# Patient Record
Sex: Female | Born: 1955
Health system: Southern US, Community
[De-identification: ages and names within clinical notes are randomized; demographics above are authoritative.]

## PROBLEM LIST (undated history)

## (undated) DIAGNOSIS — G930 Cerebral cysts: Secondary | ICD-10-CM

## (undated) DIAGNOSIS — E785 Hyperlipidemia, unspecified: Secondary | ICD-10-CM

## (undated) DIAGNOSIS — N898 Other specified noninflammatory disorders of vagina: Secondary | ICD-10-CM

## (undated) DIAGNOSIS — N941 Unspecified dyspareunia: Secondary | ICD-10-CM

## (undated) DIAGNOSIS — L900 Lichen sclerosus et atrophicus: Secondary | ICD-10-CM

## (undated) HISTORY — DX: Cerebral cysts: G93.0

## (undated) HISTORY — PX: CARDIAC CATHETERIZATION: SHX172

## (undated) HISTORY — DX: Hyperlipidemia, unspecified: E78.5

## (undated) HISTORY — DX: Lichen sclerosus et atrophicus: L90.0

## (undated) HISTORY — DX: Other specified noninflammatory disorders of vagina: N89.8

## (undated) HISTORY — DX: Unspecified dyspareunia: N94.10

---

## 2000-11-15 ENCOUNTER — Ambulatory Visit (HOSPITAL_COMMUNITY): Admission: RE | Admit: 2000-11-15 | Discharge: 2000-11-15 | Payer: Self-pay | Admitting: Internal Medicine

## 2000-11-15 ENCOUNTER — Encounter: Payer: Self-pay | Admitting: Internal Medicine

## 2003-05-04 ENCOUNTER — Ambulatory Visit (HOSPITAL_COMMUNITY): Admission: RE | Admit: 2003-05-04 | Discharge: 2003-05-04 | Payer: Self-pay | Admitting: Family Medicine

## 2009-10-08 ENCOUNTER — Other Ambulatory Visit: Admission: RE | Admit: 2009-10-08 | Discharge: 2009-10-08 | Payer: Self-pay | Admitting: Obstetrics and Gynecology

## 2010-10-09 ENCOUNTER — Emergency Department (HOSPITAL_COMMUNITY)
Admission: EM | Admit: 2010-10-09 | Discharge: 2010-10-09 | Disposition: A | Discharge status: Home / Self Care | Payer: No Typology Code available for payment source | Attending: Emergency Medicine | Admitting: Emergency Medicine

## 2010-10-09 DIAGNOSIS — Y92410 Unspecified street and highway as the place of occurrence of the external cause: Secondary | ICD-10-CM | POA: Insufficient documentation

## 2010-10-09 DIAGNOSIS — IMO0002 Reserved for concepts with insufficient information to code with codable children: Secondary | ICD-10-CM | POA: Insufficient documentation

## 2010-10-09 DIAGNOSIS — Z79899 Other long term (current) drug therapy: Secondary | ICD-10-CM | POA: Insufficient documentation

## 2010-10-09 DIAGNOSIS — E7800 Pure hypercholesterolemia, unspecified: Secondary | ICD-10-CM | POA: Insufficient documentation

## 2010-10-14 ENCOUNTER — Other Ambulatory Visit: Payer: Self-pay | Admitting: Adult Health

## 2010-10-14 ENCOUNTER — Other Ambulatory Visit (HOSPITAL_COMMUNITY)
Admission: RE | Admit: 2010-10-14 | Discharge: 2010-10-14 | Disposition: A | Discharge status: Home / Self Care | Payer: 59 | Source: Ambulatory Visit | Attending: Obstetrics and Gynecology | Admitting: Obstetrics and Gynecology

## 2010-10-14 DIAGNOSIS — Z01419 Encounter for gynecological examination (general) (routine) without abnormal findings: Secondary | ICD-10-CM | POA: Insufficient documentation

## 2011-09-18 ENCOUNTER — Telehealth (HOSPITAL_COMMUNITY): Payer: Self-pay

## 2015-03-29 ENCOUNTER — Encounter: Payer: Self-pay | Admitting: Adult Health

## 2015-03-29 ENCOUNTER — Encounter: Payer: Self-pay | Admitting: *Deleted

## 2015-03-29 ENCOUNTER — Other Ambulatory Visit (HOSPITAL_COMMUNITY)
Admission: RE | Admit: 2015-03-29 | Discharge: 2015-03-29 | Disposition: A | Discharge status: Home / Self Care | Payer: Commercial Managed Care - HMO | Source: Ambulatory Visit | Attending: Adult Health | Admitting: Adult Health

## 2015-03-29 ENCOUNTER — Telehealth: Payer: Self-pay | Admitting: *Deleted

## 2015-03-29 ENCOUNTER — Ambulatory Visit (INDEPENDENT_AMBULATORY_CARE_PROVIDER_SITE_OTHER): Payer: Commercial Managed Care - HMO | Admitting: Adult Health

## 2015-03-29 VITALS — BP 140/90 | HR 60 | Ht 64.0 in | Wt 159.5 lb

## 2015-03-29 DIAGNOSIS — Z1212 Encounter for screening for malignant neoplasm of rectum: Secondary | ICD-10-CM

## 2015-03-29 DIAGNOSIS — Z1151 Encounter for screening for human papillomavirus (HPV): Secondary | ICD-10-CM | POA: Diagnosis present

## 2015-03-29 DIAGNOSIS — N898 Other specified noninflammatory disorders of vagina: Secondary | ICD-10-CM

## 2015-03-29 DIAGNOSIS — Z01419 Encounter for gynecological examination (general) (routine) without abnormal findings: Secondary | ICD-10-CM | POA: Diagnosis not present

## 2015-03-29 DIAGNOSIS — L900 Lichen sclerosus et atrophicus: Secondary | ICD-10-CM

## 2015-03-29 DIAGNOSIS — N941 Unspecified dyspareunia: Secondary | ICD-10-CM

## 2015-03-29 HISTORY — DX: Unspecified dyspareunia: N94.10

## 2015-03-29 HISTORY — DX: Lichen sclerosus et atrophicus: L90.0

## 2015-03-29 HISTORY — DX: Other specified noninflammatory disorders of vagina: N89.8

## 2015-03-29 LAB — HEMOCCULT GUIAC POC 1CARD (OFFICE): Fecal Occult Blood, POC: NEGATIVE

## 2015-03-29 MED ORDER — CLOBETASOL PROPIONATE 0.05 % EX CREA: TOPICAL_CREAM | CUTANEOUS | Status: DC

## 2015-03-29 NOTE — Progress Notes (Signed)
Patient ID: Kelsey RenshawCathy W Howell, female   DOB: 1955/09/19, 59 y.o.   MRN: [id number] History of Present Illness: Kelsey AngCathy is a 59 year old white female,G2P2,married in for a well woman gyn exam and pap.She is complaining of vaginal dryness and itching and pain with sex.Her last pap was 10/14/10,she had been helping care for her Mom and not her self she says. PCP is Dr Phillips OdorGolding.   Current Medications, Allergies, Past Medical History, Past Surgical History, Family History and Social History were reviewed in [facility] electronic medical record.     Review of Systems: Patient denies any daily headaches(but had middle of night headaches at same spot and saw Dr Phillips OdorGolding and had MRI,has brain cyst), hearing loss, fatigue, blurred vision, shortness of breath, chest pain, abdominal pain, problems with bowel movements, urination.No joint pain.Has some mood swings,see HPI for positives.She is still working at [employer]    Physical Exam:BP 140/90 mmHg  Pulse 60  Ht 5\' 4"  (1.626 m)  Wt 159 lb 8 oz (72.349 kg)  BMI 27.36 kg/m2 General:  Well developed, well nourished, no acute distress Skin:  Warm and dry Neck:  Midline trachea, normal thyroid, good ROM, no lymphadenopathy,no carotid bruits heard Lungs; Clear to auscultation bilaterally Breast:  No dominant palpable mass, retraction, or nipple discharge Cardiovascular: Regular rate and rhythm Abdomen:  Soft, non tender, no hepatosplenomegaly Pelvic:  External genitalia has thickened white skin and some excoriated areas, near clitoris and inner labia.  The vagina has decreased color, moisture and rugae. Urethra has no lesions or masses. The cervix is smooth,pap with HPV performed.  Uterus is felt to be normal size, shape, and contour.  No adnexal masses or tenderness noted.Bladder is non tender, no masses felt. Rectal: Good sphincter tone, no polyps, or hemorrhoids felt.  Hemoccult negative. Extremities/musculoskeletal:  No swelling or varicosities  noted, no clubbing or cyanosis Psych:  No mood changes, alert and cooperative,seems happy Discussed lichen sclerosus with her and showed her pictures,she is aware it is chronic condition,and that about 3% chance it is cancerous,if not better with temovate,will get biopsy.  Impression: Well woman gyn exam and pap Dyspareunia Vaginal dryness Lichen sclerosus      Plan: Rx temovate 0.05% cream use bid x 2 weeks then 2-3 x weekly with 2 refills Follow up in  4 weeks Try Luvena every 72 hours in vagina Try astroglide with sex Get mammogram now(past due) and yearly Colonoscopy advised Labs with PCP Physical in 1 year  Review handout on lichen sclerosus

## 2015-03-29 NOTE — Telephone Encounter (Signed)
Work note given

## 2015-03-29 NOTE — Patient Instructions (Addendum)
Follow up in 4 weeks Use temovate 2 x daily for 2 weeks then 2-3 x weekly Physical in  1 year Get mammogram now and yearly Use luvena every 3 days in vagina Get astroglide for sex put on you and him Labs with PCP Lichen Sclerosus Lichen sclerosus is a skin problem. It can happen on any part of the body, but it commonly involves the anal or genital areas. It can cause itching and discomfort in these areas. Treatment can help to control symptoms. When the genital area is affected, getting treatment is important because the condition can cause scarring that may lead to other problems. CAUSES The cause of this condition is not known. It could be the result of an overactive immune system or a lack of certain hormones. Lichen sclerosus is not an infection or a fungus. It is not passed from one person to another (not contagious). RISK FACTORS This condition is more likely to develop in women, usually after menopause. SYMPTOMS Symptoms of this condition include:  Thin, wrinkled, white areas on the skin.  Thickened white areas on the skin.  Red and swollen patches (lesions) on the skin.  Tears or cracks in the skin.  Bruising.  Blood blisters.  Severe itching. You may also have pain, itching, or burning with urination. Constipation is also common in people with lichen sclerosus. DIAGNOSIS This condition may be diagnosed with a physical exam. In some cases, a tissue sample (biopsy sample) may be removed to be looked at under a microscope. TREATMENT This condition is usually treated with medicated creams or ointments (topical steroids) that are applied over the affected areas. HOME CARE INSTRUCTIONS  Take over-the-counter and prescription medicines only as told by your health care provider.  Use creams or ointments as told by your health care provider.  Do not scratch the affected areas of skin.  Women should keep the vaginal area as clean and dry as possible.  Keep all follow-up  visits as told by your health care provider. This is important. SEEK MEDICAL CARE IF:  You have increasing redness, swelling, or pain in the affected area.  You have fluid, blood, or pus coming from the affected area.  You have new lesions on your skin.  You have pain or burning with urination.  You have pain during sex.   This information is not intended to replace advice given to you by your health care provider. Make sure you discuss any questions you have with your health care provider.   Document Released: 09/28/2010 Document Revised: 01/27/2015 Document Reviewed: 08/03/2014 Elsevier Interactive Patient Education [employer]2016 Elsevier Inc.

## 2015-03-30 LAB — CYTOLOGY - PAP

## 2015-04-26 ENCOUNTER — Ambulatory Visit (INDEPENDENT_AMBULATORY_CARE_PROVIDER_SITE_OTHER): Payer: Commercial Managed Care - HMO | Admitting: Adult Health

## 2015-04-26 ENCOUNTER — Encounter: Payer: Self-pay | Admitting: Adult Health

## 2015-04-26 VITALS — BP 122/80 | HR 56 | Ht 64.0 in | Wt 161.0 lb

## 2015-04-26 DIAGNOSIS — L900 Lichen sclerosus et atrophicus: Secondary | ICD-10-CM

## 2015-04-26 DIAGNOSIS — N898 Other specified noninflammatory disorders of vagina: Secondary | ICD-10-CM

## 2015-04-26 NOTE — Patient Instructions (Signed)
Use temovate 2-3 x weekly  Follow up in 3 months

## 2015-04-26 NOTE — Progress Notes (Signed)
Subjective:     Patient ID: Lisbeth Renshawathy W Wentz, female   DOB: 08/26/1955, 59 y.o.   MRN: [id number]  HPI Lynden AngCathy is a 59 year old white female,married back in follow up of using luvena,astro glide and temovate for vaginal dryness, dyspareunia and lichen sclerosis,she used luvena and astro glide the first time and was fine and then the second time felt irritated.Sex was less painful and since using temovate has no itching.She is still working full time and caring for her Mom.  Review of Systems Patient denies any headaches, hearing loss, fatigue, blurred vision, shortness of breath, chest pain, abdominal pain, problems with bowel movements, urination, or intercourse. No joint pain or mood swings.See HPI for positives. Reviewed past medical,surgical, social and family history. Reviewed medications and allergies.     Objective:   Physical Exam BP 122/80 mmHg  Pulse 56  Ht 5\' 4"  (1.626 m)  Wt 161 lb (73.029 kg)  BMI 27.62 kg/m2Skin warm and dry, on exam external genitalia has no white areas,tissues thin, has some redness near rectal area, has had diarrhea     Assessment:     Vaginal dryness Lichen sclerosus     Plan:    Note given to return to work today Continue temovate 2-3 x weekly Try luvena and astro glide again Return in 3 months for follow up exam   Can use Desitin and Vaseline after BMs

## 2015-07-27 ENCOUNTER — Encounter: Payer: Self-pay | Admitting: Adult Health

## 2015-07-27 ENCOUNTER — Ambulatory Visit (INDEPENDENT_AMBULATORY_CARE_PROVIDER_SITE_OTHER): Payer: Commercial Managed Care - HMO | Admitting: Adult Health

## 2015-07-27 VITALS — BP 130/80 | HR 60 | Ht 64.0 in | Wt 164.0 lb

## 2015-07-27 DIAGNOSIS — L900 Lichen sclerosus et atrophicus: Secondary | ICD-10-CM

## 2015-07-27 DIAGNOSIS — N898 Other specified noninflammatory disorders of vagina: Secondary | ICD-10-CM | POA: Diagnosis not present

## 2015-07-27 NOTE — Progress Notes (Signed)
Subjective:     Patient ID: Kelsey Howell, female   DOB: 12-Jun-1955, 60 y.o.   MRN: [id number]  HPI Kelsey Howell is a 60 year old white female, married back in follow up of using temovate for lichen sclerosus, and she says she feels much better.Has noted pain in right wrist when first starts having sex then it is gone.  Review of Systems Patient denies any headaches, hearing loss, fatigue, blurred vision, shortness of breath, chest pain, abdominal pain, problems with bowel movements, urination.  No joint pain or mood swings.See HPI for positives. Reviewed past medical,surgical, social and family history. Reviewed medications and allergies.     Objective:   Physical Exam BP 130/80 mmHg  Pulse 60  Ht 5\' 4"  (1.626 m)  Wt 164 lb (74.39 kg)  BMI 28.14 kg/m2Skin warm and dry, there are no areas of whiteness, skin is still thin but looks much better and feels better.    Assessment:     Lichen sclerosus  Vaginal dryness    Plan:       Continue temovate 2-3 x a week Use astroglide and try luvena again, put astro glide on him too. Note given for work, stating was here this am Follow up prn

## 2015-07-27 NOTE — Patient Instructions (Signed)
Temovate 2-3 x weekly  Follow up prn

## 2016-07-11 ENCOUNTER — Emergency Department (HOSPITAL_COMMUNITY): Payer: Commercial Managed Care - HMO

## 2016-07-11 ENCOUNTER — Encounter (HOSPITAL_COMMUNITY): Payer: Self-pay | Admitting: *Deleted

## 2016-07-11 ENCOUNTER — Emergency Department (HOSPITAL_COMMUNITY)
Admission: EM | Admit: 2016-07-11 | Discharge: 2016-07-11 | Disposition: A | Discharge status: Home / Self Care | Payer: Commercial Managed Care - HMO | Attending: Emergency Medicine | Admitting: Emergency Medicine

## 2016-07-11 DIAGNOSIS — G5602 Carpal tunnel syndrome, left upper limb: Secondary | ICD-10-CM | POA: Diagnosis not present

## 2016-07-11 DIAGNOSIS — Z5181 Encounter for therapeutic drug level monitoring: Secondary | ICD-10-CM | POA: Insufficient documentation

## 2016-07-11 DIAGNOSIS — R200 Anesthesia of skin: Secondary | ICD-10-CM | POA: Diagnosis not present

## 2016-07-11 DIAGNOSIS — R9431 Abnormal electrocardiogram [ECG] [EKG]: Secondary | ICD-10-CM | POA: Diagnosis not present

## 2016-07-11 DIAGNOSIS — Z79899 Other long term (current) drug therapy: Secondary | ICD-10-CM | POA: Insufficient documentation

## 2016-07-11 LAB — DIFFERENTIAL
BASOS PCT: 1 %
Basophils Absolute: 0.1 10*3/uL (ref 0.0–0.1)
EOS ABS: 0.4 10*3/uL (ref 0.0–0.7)
Eosinophils Relative: 4 %
Lymphocytes Relative: 30 %
Lymphs Abs: 3 10*3/uL (ref 0.7–4.0)
MONO ABS: 0.7 10*3/uL (ref 0.1–1.0)
MONOS PCT: 7 %
Neutro Abs: 6 10*3/uL (ref 1.7–7.7)
Neutrophils Relative %: 58 %

## 2016-07-11 LAB — CBC
HCT: 41.9 % (ref 36.0–46.0)
Hemoglobin: 14.2 g/dL (ref 12.0–15.0)
MCH: 29.9 pg (ref 26.0–34.0)
MCHC: 33.9 g/dL (ref 30.0–36.0)
MCV: 88.2 fL (ref 78.0–100.0)
Platelets: 274 10*3/uL (ref 150–400)
RBC: 4.75 MIL/uL (ref 3.87–5.11)
RDW: 13.3 % (ref 11.5–15.5)
WBC: 10.2 10*3/uL (ref 4.0–10.5)

## 2016-07-11 LAB — PROTIME-INR
INR: 0.93
Prothrombin Time: 12.4 seconds (ref 11.4–15.2)

## 2016-07-11 LAB — COMPREHENSIVE METABOLIC PANEL
ALT: 22 U/L (ref 14–54)
AST: 25 U/L (ref 15–41)
Albumin: 4 g/dL (ref 3.5–5.0)
Alkaline Phosphatase: 72 U/L (ref 38–126)
Anion gap: 10 (ref 5–15)
BUN: 12 mg/dL (ref 6–20)
CHLORIDE: 104 mmol/L (ref 101–111)
CO2: 24 mmol/L (ref 22–32)
CREATININE: 0.9 mg/dL (ref 0.44–1.00)
Calcium: 9.4 mg/dL (ref 8.9–10.3)
Glucose, Bld: 96 mg/dL (ref 65–99)
POTASSIUM: 3.7 mmol/L (ref 3.5–5.1)
Sodium: 138 mmol/L (ref 135–145)
TOTAL PROTEIN: 7.8 g/dL (ref 6.5–8.1)
Total Bilirubin: 0.8 mg/dL (ref 0.3–1.2)

## 2016-07-11 LAB — I-STAT TROPONIN, ED: TROPONIN I, POC: 0 ng/mL (ref 0.00–0.08)

## 2016-07-11 LAB — APTT: aPTT: 30 seconds (ref 24–36)

## 2016-07-11 NOTE — ED Triage Notes (Signed)
Pt reports onset approx 1500 while at work, she reached to get something and then onset of numbness to left forearm and fingers. Also had numbness to left side of face and "felt like her face was drawing up." reports episode lasted a few minutes and then resolved. No neuro deficits noted at triage. Grips are equal, no facial droop noted.

## 2016-07-12 NOTE — ED Provider Notes (Signed)
[facility]-EMERGENCY DEPT Provider Note   CSN: [id number] Arrival date & time: 07/11/16  1549     History   Chief Complaint Chief Complaint  Patient presents with  . Numbness    HPI Kelsey Howell is a 61 y.o. female.  HPI Patient presents with tingling and numbness in her left hand. Began she was at work around 3:00. States it started in her forearm into her hand and was in her ring and middle finger. Felt like when hand will fall asleep. She gets episodes of this at night. Lasted a few minutes and resolved after she been some shaking on it. States also during the episode her left side of her face began to tingle and drew back a little bit. No difficulty speaking. No reported facial asymmetry with people saw her. No drooling.   Past Medical History:  Diagnosis Date  . Cyst of brain   . Dyspareunia, female 03/29/2015  . Hyperlipidemia   . Lichen sclerosus 03/29/2015  . Vaginal dryness 03/29/2015    Patient Active Problem List   Diagnosis Date Noted  . Dyspareunia, female 03/29/2015  . Vaginal dryness 03/29/2015  . Lichen sclerosus 03/29/2015    Past Surgical History:  Procedure Laterality Date  . CARDIAC CATHETERIZATION      OB History    Gravida Para Term Preterm AB Living   2 2       2    SAB TAB Ectopic Multiple Live Births                   Home Medications    Prior to Admission medications   Medication Sig Start Date End Date Taking? Authorizing Provider  acetaminophen (TYLENOL) 500 MG tablet Take 500 mg by mouth every 6 (six) hours as needed for headache (or pain).    Yes Historical Provider, MD  clobetasol cream (TEMOVATE) 0.05 % Use cream 2 x daily for 2 weeks then 2-3 x per week Patient taking differently: Apply 1 application topically See admin instructions. Two to three times a week as needed for itching 03/29/15  Yes Adline Potter, NP  ibuprofen (ADVIL,MOTRIN) 200 MG tablet Take 200 mg by mouth every 6 (six) hours as needed (for hand pain).    Yes  Historical Provider, MD  pravastatin (PRAVACHOL) 20 MG tablet Take 10 mg by mouth daily.   Yes Historical Provider, MD    Family History Family History  Problem Relation Age of Onset  . Diabetes Mother   . Hyperlipidemia Mother   . Heart attack Mother   . Dementia Mother   . Heart attack Father   . Heart attack Brother   . Ulcerative colitis Daughter   . Ulcerative colitis Son   . Cancer Maternal Grandmother   . Stroke Paternal Grandmother   . Heart attack Paternal Grandfather   . Ulcerative colitis Brother     Social History Social History  Substance Use Topics  . Smoking status: Never Smoker  . Smokeless tobacco: Never Used  . Alcohol use No     Allergies   Niacin and related and Tape   Review of Systems Review of Systems  Constitutional: Negative for appetite change.  HENT: Negative for congestion.   Respiratory: Negative for shortness of breath.   Cardiovascular: Negative for chest pain.  Gastrointestinal: Negative for abdominal pain.  Genitourinary: Negative for dyspareunia.  Musculoskeletal: Negative for back pain.  Neurological: Positive for numbness. Negative for facial asymmetry and weakness.  Hematological: Negative for adenopathy.  Psychiatric/Behavioral:  Negative for agitation.     Physical Exam Updated Vital Signs BP 128/84   Pulse 61   Temp 98.7 F (37.1 C) (Oral)   Resp 16   SpO2 97%   Physical Exam  Constitutional: She appears well-developed.  HENT:  Head: Normocephalic and atraumatic.  Eyes: EOM are normal.  Neck: Neck supple.  Cardiovascular: Normal rate.   Pulmonary/Chest: Effort normal.  Abdominal: Soft.  Musculoskeletal: Normal range of motion. She exhibits no edema.  Neurological: She is alert.  Face symmetric. Equal smile. Equal sensation in bilateral face. Good grips bilaterally. Sensation intact over radial median and ulnar distribution. Good flexion-extension at the shoulders elbows and wrists.  Skin: Skin is warm.  Capillary refill takes less than 2 seconds.     ED Treatments / Results  Labs (all labs ordered are listed, but only abnormal results are displayed) Labs Reviewed  PROTIME-INR  APTT  CBC  DIFFERENTIAL  COMPREHENSIVE METABOLIC PANEL  I-STAT TROPOININ, ED    EKG  EKG Interpretation  Date/Time:  Tuesday July 11 2016 16:04:03 EST Ventricular Rate:  82 PR Interval:  166 QRS Duration: 84 QT Interval:  378 QTC Calculation: 441 R Axis:   -49 Text Interpretation:  Normal sinus rhythm Left anterior fascicular block Abnormal ECG Confirmed by Rubin PayorPICKERING  MD, Harrold DonathNATHAN [phone]([id number]) on 07/11/2016 9:11:13 PM       Radiology Ct Head Wo Contrast  Result Date: 07/11/2016 CLINICAL DATA:  Facial numbness and numbness in the left upper extremity began today, no trauma EXAM: CT HEAD WITHOUT CONTRAST TECHNIQUE: Contiguous axial images were obtained from the base of the skull through the vertex without intravenous contrast. COMPARISON:  None. FINDINGS: Brain: The ventricular system is within upper limits of normal and the septum is midline in position. The fourth ventricle and basilar cisterns are unremarkable. No hemorrhage, mass lesion, or acute infarction is seen. Low-attenuation in the anterior left temporal lobe probably represents an arachnoid cyst and comparison with any prior imaging of the brain is recommended to assess stability. This area measures approximately 16 x 32 mm on axial image 7 series 2. Vascular: No vascular abnormality is seen on this unenhanced study. Skull: No calvarial abnormality is seen. Sinuses/Orbits: The paranasal sinuses are well pneumatized. Other: None. IMPRESSION: 1. No acute intracranial abnormality. 2. Low-attenuation in the anterior left temporal lobe probably represents an arachnoid cyst. Compare with any prior brain imaging exam for stability. Electronically Signed   By: Dwyane DeePaul  Barry M.D.   On: 07/11/2016 16:51    Procedures Procedures (including critical care  time)  Medications Ordered in ED Medications - No data to display   Initial Impression / Assessment and Plan / ED Course  I have reviewed the triage vital signs and the nursing notes.  Pertinent labs & imaging results that were available during my care of the patient were reviewed by me and considered in my medical decision making (see chart for details).     Patient with left hand tingling. I think it is more likely carpal tunnel. She is working in [employer] and has episodes like this at night. The face is not associated with it in the past. Nonfocal exam now. Has a known arachnoid cyst. Somewhat similar in size from MRI 2 years ago. Patient was aware of the cyst. Has a brace to wear at home for wrist. Will discharge home to follow-up with her primary care doctor.  Final Clinical Impressions(s) / ED Diagnoses   Final diagnoses:  Carpal tunnel syndrome of left  wrist    New Prescriptions Discharge Medication List as of 07/11/2016  9:38 PM       Benjiman Core, MD 07/12/16 [phone]

## 2016-08-28 DIAGNOSIS — Z1389 Encounter for screening for other disorder: Secondary | ICD-10-CM | POA: Diagnosis not present

## 2016-08-28 DIAGNOSIS — R7309 Other abnormal glucose: Secondary | ICD-10-CM | POA: Diagnosis not present

## 2016-08-28 DIAGNOSIS — E782 Mixed hyperlipidemia: Secondary | ICD-10-CM | POA: Diagnosis not present

## 2016-08-28 DIAGNOSIS — R200 Anesthesia of skin: Secondary | ICD-10-CM | POA: Diagnosis not present

## 2016-11-28 DIAGNOSIS — M545 Low back pain: Secondary | ICD-10-CM | POA: Diagnosis not present

## 2016-11-28 DIAGNOSIS — M5410 Radiculopathy, site unspecified: Secondary | ICD-10-CM | POA: Diagnosis not present

## 2017-01-02 DIAGNOSIS — Z1211 Encounter for screening for malignant neoplasm of colon: Secondary | ICD-10-CM | POA: Diagnosis not present

## 2017-01-16 ENCOUNTER — Ambulatory Visit (INDEPENDENT_AMBULATORY_CARE_PROVIDER_SITE_OTHER): Payer: 59 | Admitting: Adult Health

## 2017-01-16 ENCOUNTER — Encounter: Payer: Self-pay | Admitting: Adult Health

## 2017-01-16 ENCOUNTER — Other Ambulatory Visit (HOSPITAL_COMMUNITY)
Admission: RE | Admit: 2017-01-16 | Discharge: 2017-01-16 | Disposition: A | Discharge status: Home / Self Care | Payer: 59 | Source: Ambulatory Visit | Attending: Adult Health | Admitting: Adult Health

## 2017-01-16 VITALS — BP 120/80 | HR 78 | Ht 65.0 in | Wt 164.0 lb

## 2017-01-16 DIAGNOSIS — Z01419 Encounter for gynecological examination (general) (routine) without abnormal findings: Secondary | ICD-10-CM

## 2017-01-16 DIAGNOSIS — L900 Lichen sclerosus et atrophicus: Secondary | ICD-10-CM

## 2017-01-16 DIAGNOSIS — Z01411 Encounter for gynecological examination (general) (routine) with abnormal findings: Secondary | ICD-10-CM | POA: Diagnosis not present

## 2017-01-16 DIAGNOSIS — N898 Other specified noninflammatory disorders of vagina: Secondary | ICD-10-CM | POA: Diagnosis not present

## 2017-01-16 NOTE — Patient Instructions (Signed)
Physical in 1 year Pap in 3 if normal Mammogram yearly  

## 2017-01-16 NOTE — Progress Notes (Addendum)
Patient ID: Kelsey Howell, female   DOB: Jul 28, 1955, 61 y.o.   MRN: [id number] History of Present Illness: Kelsey Howell is a 61 year old white female, married in for well woman gyn exam and pap.She is retired now. PCP is Dr Phillips Odor.    Current Medications, Allergies, Past Medical History, Past Surgical History, Family History and Social History were reviewed in [employer] [facility] record.     Review of Systems: Patient denies any headaches, hearing loss, fatigue, blurred vision, shortness of breath, chest pain, abdominal pain, problems with bowel movements, urination, or intercourse(sex painful at times). No joint pain or mood swings. +vaginal dryness   Physical Exam:BP 120/80 (BP Location: Left Arm, Patient Position: Sitting, Cuff Size: Small)   Pulse 78   Ht 5\' 5"  (1.651 m)   Wt 164 lb (74.4 kg)   BMI 27.29 kg/m  General:  Well developed, well nourished, no acute distress Skin:  Warm and dry Neck:  Midline trachea, normal thyroid, good ROM, no lymphadenopathy,no carotid bruits heard. Lungs; Clear to auscultation bilaterally Breast:  No dominant palpable mass, retraction, or nipple discharge Cardiovascular: Regular rate and rhythm Abdomen:  Soft, non tender, no hepatosplenomegaly Pelvic:  External genitalia is normal in appearance, except has area of LSA on right inner labia and near clitoris   The vagina is pale and dry Urethra has no lesions or masses. The cervix is smooth, pap with HPV performed.  Uterus is felt to be normal size, shape, and contour.  No adnexal masses or tenderness noted.Bladder is non tender, no masses felt. Rectal: deferred just did cologard cards  Extremities/musculoskeletal:  No swelling or varicosities noted, no clubbing or cyanosis Psych:  No mood changes, alert and cooperative,seems happy PHQ 2 score 0.  Impression: 1. Encounter for gynecological examination with Papanicolaou smear of cervix   2. Lichen sclerosus   3. Vaginal dryness        Plan: Use temovate 2-3 x weekly Try luvena again Use astroglide with sex  Physical in 1 year Pap in 3 if normal Mammogram 9/10 at [facility] and then yearly Labs with PCP

## 2017-01-16 NOTE — Addendum Note (Signed)
Addended by: Federico Flake A on: 01/16/2017 09:54 AM   Modules accepted: Orders

## 2017-01-17 LAB — CYTOLOGY - PAP
Diagnosis: NEGATIVE
HPV: NOT DETECTED

## 2017-01-29 DIAGNOSIS — Z1231 Encounter for screening mammogram for malignant neoplasm of breast: Secondary | ICD-10-CM | POA: Diagnosis not present

## 2017-03-06 DIAGNOSIS — Z23 Encounter for immunization: Secondary | ICD-10-CM | POA: Diagnosis not present

## 2017-05-21 DIAGNOSIS — J209 Acute bronchitis, unspecified: Secondary | ICD-10-CM | POA: Diagnosis not present

## 2017-05-21 DIAGNOSIS — R05 Cough: Secondary | ICD-10-CM | POA: Diagnosis not present

## 2017-08-22 DIAGNOSIS — E782 Mixed hyperlipidemia: Secondary | ICD-10-CM | POA: Diagnosis not present

## 2017-08-22 DIAGNOSIS — Z1389 Encounter for screening for other disorder: Secondary | ICD-10-CM | POA: Diagnosis not present

## 2017-08-22 DIAGNOSIS — R7309 Other abnormal glucose: Secondary | ICD-10-CM | POA: Diagnosis not present

## 2017-08-22 DIAGNOSIS — Z6828 Body mass index (BMI) 28.0-28.9, adult: Secondary | ICD-10-CM | POA: Diagnosis not present

## 2017-08-30 DIAGNOSIS — Z1211 Encounter for screening for malignant neoplasm of colon: Secondary | ICD-10-CM | POA: Diagnosis not present

## 2018-03-15 DIAGNOSIS — Z23 Encounter for immunization: Secondary | ICD-10-CM | POA: Diagnosis not present

## 2018-05-18 IMAGING — CT CT HEAD W/O CM
3 series · 15 of 47 positions shown, 18 images · non-contrast
Comparison: None.

CLINICAL DATA: Facial numbness and numbness in the left upper
extremity began today, no trauma

EXAM:
CT HEAD WITHOUT CONTRAST
TECHNIQUE: Contiguous axial images were obtained from the base of the skull
through the vertex without intravenous contrast.

[Series 2: head 5.0 h30s · axial · 0.42mm/px · z∈[-127,+3]mm · 9 of 32 slices shown, 12 images]
[im 3/32  brain]
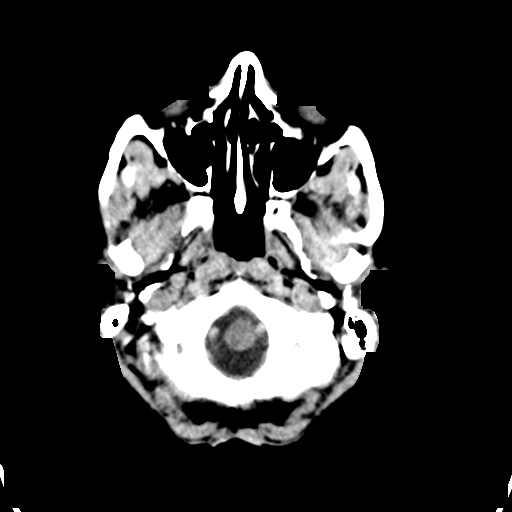
[im 3/32  bone]
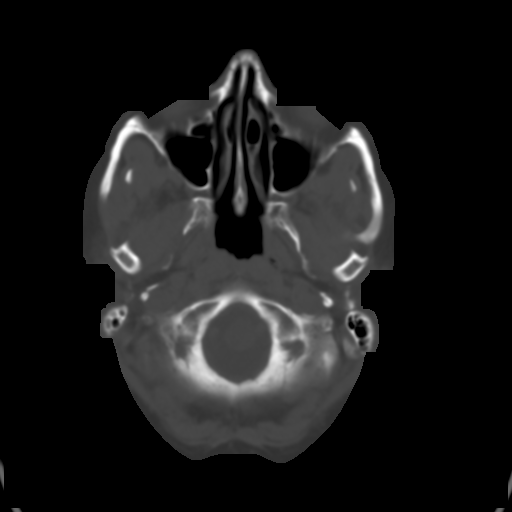
[im 6/32  brain]
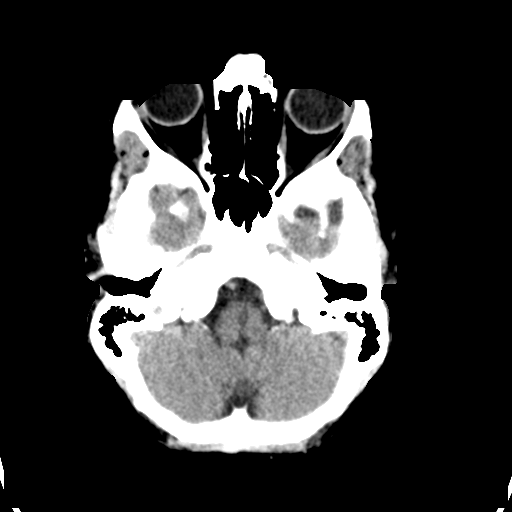
[im 9/32  brain]
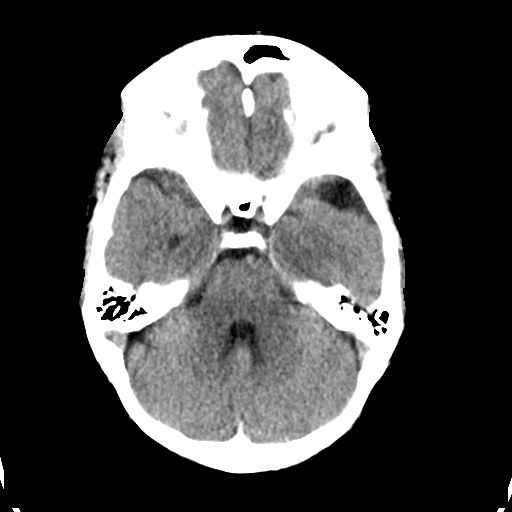
[im 12/32  brain]
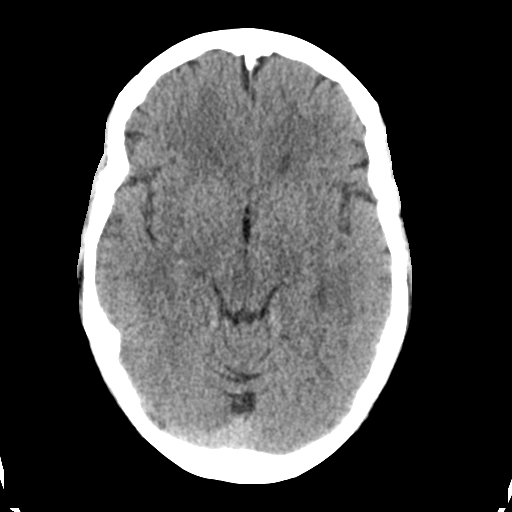
[im 17/32  brain]
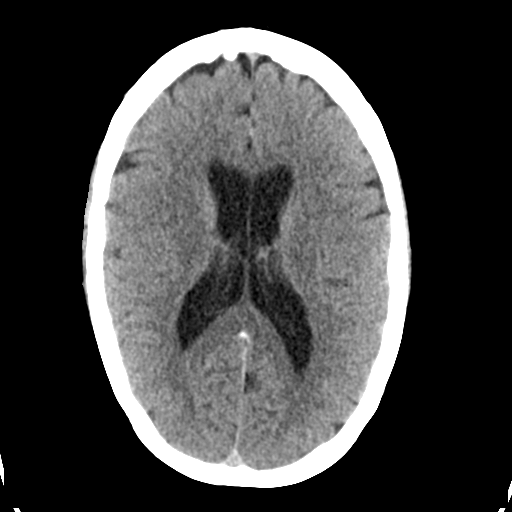
[im 17/32  bone]
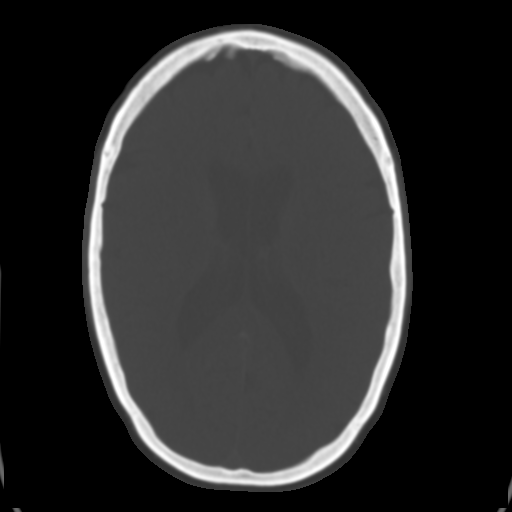
[im 20/32  brain]
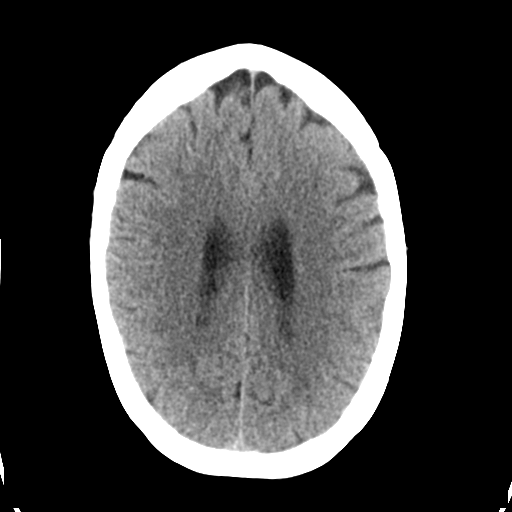
[im 23/32  brain]
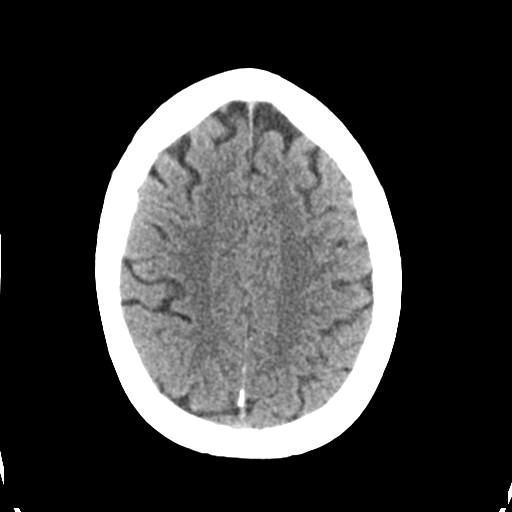
[im 26/32  brain]
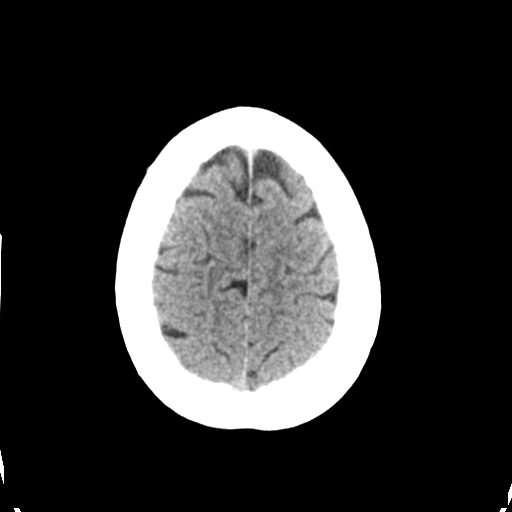
[im 29/32  brain]
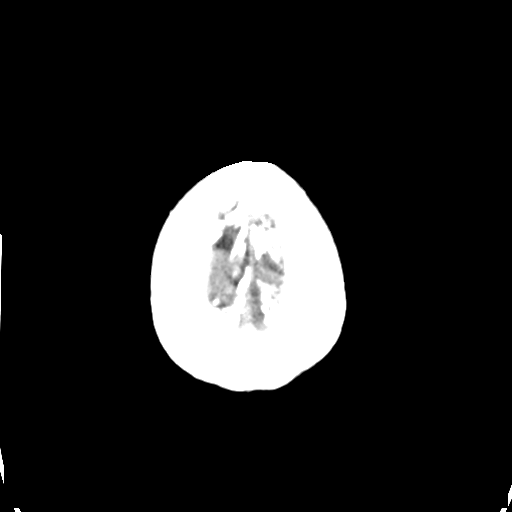
[im 29/32  bone]
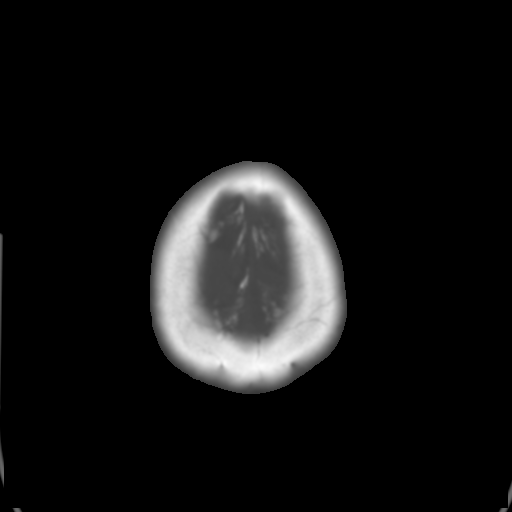

[Series 4: head 3.0 mpr cor · coronal · 0.31mm/px · 3 of 68 slices shown]
[im 23/68  brain]
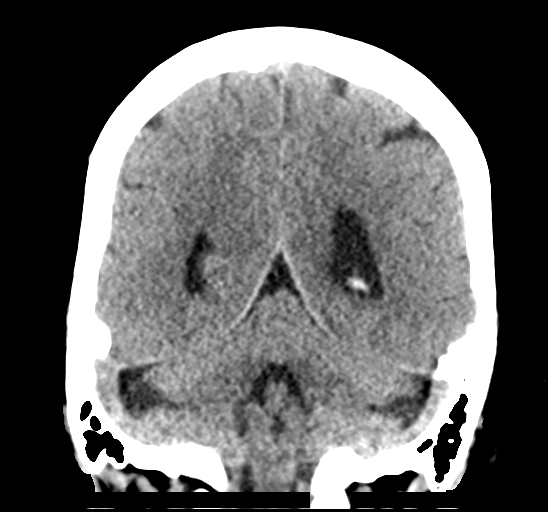
[im 30/68  brain]
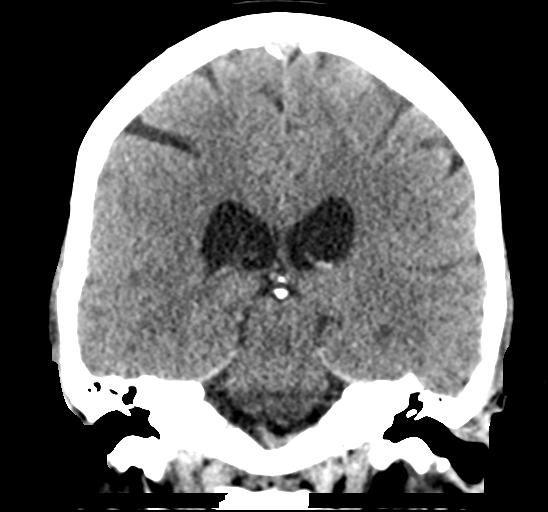
[im 38/68  brain]
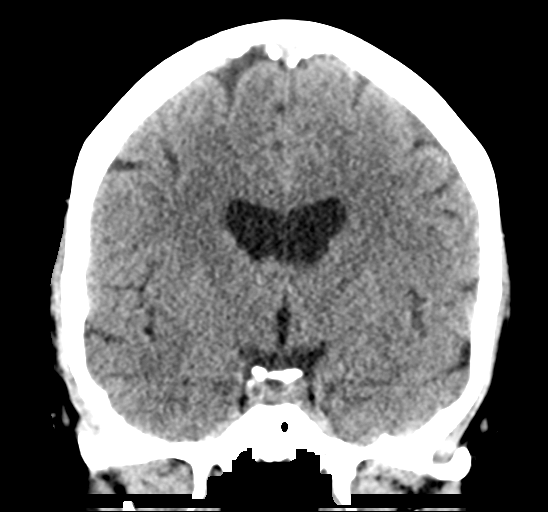

[Series 5: head 3.0 mpr sag · sagittal · 0.31mm/px · 3 of 53 slices shown]
[im 18/53  brain]
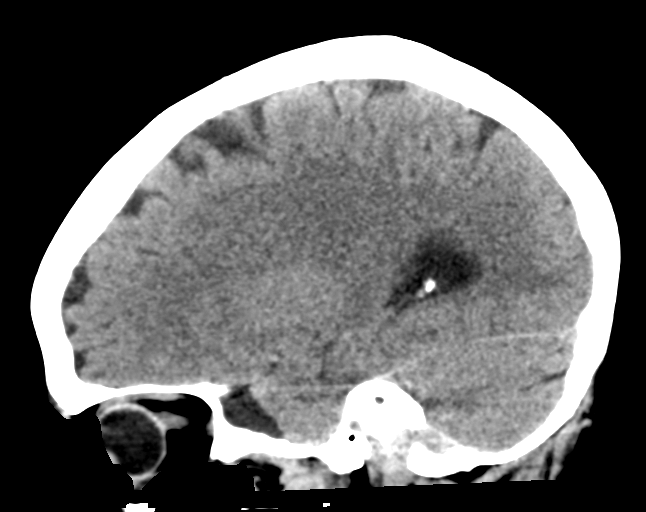
[im 27/53  brain]
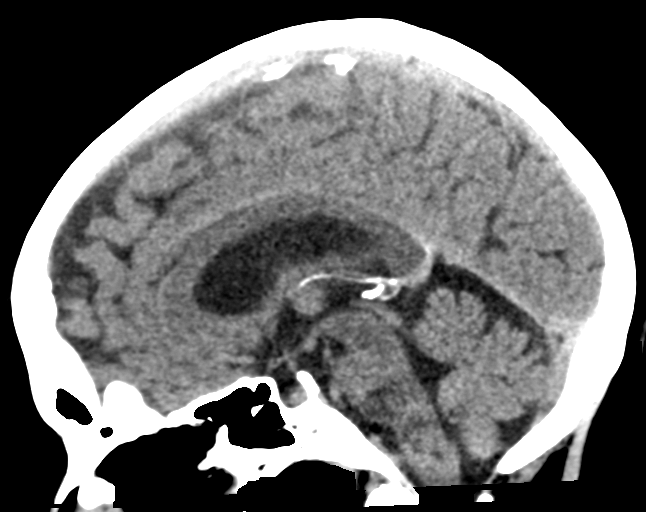
[im 35/53  brain]
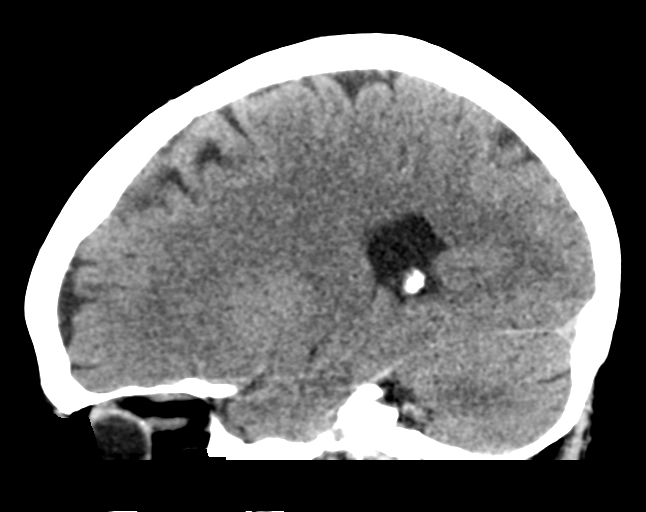

[15 of 47 positions shown; findings below may reference images not displayed]

FINDINGS: Brain: The ventricular system is within upper limits of normal and
the septum is midline in position. The fourth ventricle and basilar
cisterns are unremarkable. No hemorrhage, mass lesion, or acute
infarction is seen. Low-attenuation in the anterior left temporal
lobe probably represents an arachnoid cyst and comparison with any
prior imaging of the brain is recommended to assess stability. This
area measures approximately 16 x 32 mm on axial image 7 series 2.

Vascular: No vascular abnormality is seen on this unenhanced study.

Skull: No calvarial abnormality is seen.

Sinuses/Orbits: The paranasal sinuses are well pneumatized.

Other: None.
IMPRESSION: 1. No acute intracranial abnormality.
2. Low-attenuation in the anterior left temporal lobe probably
represents an arachnoid cyst. Compare with any prior brain imaging
exam for stability.

## 2022-05-22 HISTORY — PX: COLONOSCOPY: SHX174

## 2022-09-21 ENCOUNTER — Other Ambulatory Visit (HOSPITAL_COMMUNITY): Payer: Self-pay | Admitting: Family Medicine

## 2022-09-21 DIAGNOSIS — Z1231 Encounter for screening mammogram for malignant neoplasm of breast: Secondary | ICD-10-CM

## 2022-09-28 ENCOUNTER — Ambulatory Visit (HOSPITAL_COMMUNITY)
Admission: RE | Admit: 2022-09-28 | Discharge: 2022-09-28 | Disposition: A | Discharge status: Home / Self Care | Payer: Medicare Other | Source: Ambulatory Visit | Attending: Family Medicine | Admitting: Family Medicine

## 2022-09-28 ENCOUNTER — Encounter (HOSPITAL_COMMUNITY): Payer: Self-pay

## 2022-09-28 DIAGNOSIS — Z1231 Encounter for screening mammogram for malignant neoplasm of breast: Secondary | ICD-10-CM | POA: Insufficient documentation

## 2023-02-27 ENCOUNTER — Encounter (HOSPITAL_COMMUNITY): Payer: Self-pay

## 2023-10-30 ENCOUNTER — Other Ambulatory Visit (HOSPITAL_COMMUNITY): Payer: Self-pay | Admitting: Family Medicine

## 2023-10-30 DIAGNOSIS — Z1231 Encounter for screening mammogram for malignant neoplasm of breast: Secondary | ICD-10-CM

## 2023-11-07 ENCOUNTER — Ambulatory Visit (HOSPITAL_COMMUNITY)
Admission: RE | Admit: 2023-11-07 | Discharge: 2023-11-07 | Disposition: A | Discharge status: Home / Self Care | Source: Ambulatory Visit | Attending: Family Medicine | Admitting: Family Medicine

## 2023-11-07 DIAGNOSIS — Z1231 Encounter for screening mammogram for malignant neoplasm of breast: Secondary | ICD-10-CM | POA: Insufficient documentation

## 2023-12-19 ENCOUNTER — Other Ambulatory Visit (HOSPITAL_COMMUNITY)
Admission: RE | Admit: 2023-12-19 | Discharge: 2023-12-19 | Disposition: A | Discharge status: Home / Self Care | Source: Ambulatory Visit | Attending: Adult Health | Admitting: Adult Health

## 2023-12-19 ENCOUNTER — Encounter: Payer: Self-pay | Admitting: Adult Health

## 2023-12-19 ENCOUNTER — Ambulatory Visit: Admitting: Adult Health

## 2023-12-19 VITALS — BP 172/105 | HR 74 | Ht 65.0 in | Wt 185.0 lb

## 2023-12-19 DIAGNOSIS — Z124 Encounter for screening for malignant neoplasm of cervix: Secondary | ICD-10-CM | POA: Insufficient documentation

## 2023-12-19 DIAGNOSIS — Z1331 Encounter for screening for depression: Secondary | ICD-10-CM

## 2023-12-19 DIAGNOSIS — Z01419 Encounter for gynecological examination (general) (routine) without abnormal findings: Secondary | ICD-10-CM | POA: Diagnosis present

## 2023-12-19 DIAGNOSIS — Z1151 Encounter for screening for human papillomavirus (HPV): Secondary | ICD-10-CM | POA: Diagnosis not present

## 2023-12-19 DIAGNOSIS — L900 Lichen sclerosus et atrophicus: Secondary | ICD-10-CM

## 2023-12-19 DIAGNOSIS — I10 Essential (primary) hypertension: Secondary | ICD-10-CM | POA: Diagnosis not present

## 2023-12-19 DIAGNOSIS — L292 Pruritus vulvae: Secondary | ICD-10-CM | POA: Diagnosis not present

## 2023-12-19 MED ORDER — CLOBETASOL PROPIONATE 0.05 % EX CREA: TOPICAL_CREAM | CUTANEOUS | 3 refills | Status: AC

## 2023-12-19 NOTE — Progress Notes (Signed)
 Subjective:     Patient ID: Kelsey Howell Stage, female   DOB: 1955-06-08, 68 y.o.   MRN: [id number]  HPI Kelsey Howell is a 68 year old white female, married, PM in complaining of irritation in vaginal area, had itching on vulva but found old temovate  cream and used some of that and it is better. She needs a pap, too. Last pap was 2018, NILM, negative HPV  PCP is Dr Marvine  Review of Systems + irritation in vaginal area, had itching on vulva but found old temovate  cream and used some of that and it is better.  Reviewed past medical,surgical, social and family history. Reviewed medications and allergies.     Objective:   Physical Exam BP (!) 172/105 (BP Location: Left Arm, Patient Position: Sitting, Cuff Size: Normal)   Pulse 74   Ht 5' 5 (1.651 m)   Wt 185 lb (83.9 kg)   BMI 30.79 kg/m     Skin warm and dry. Lungs: clear to ausculation bilaterally. Cardiovascular: regular rate and rhythm.  Pelvic: external genitalia is normal in appearance, has thin white skin at introitus and inner labia and clitoral area, vagina:pale,urethra has no lesions or masses noted, cervix:smooth, pap with HR HPV genotyping perfofmed, uterus: normal size, shape and contour, non tender, no masses felt, adnexa: no masses or tenderness noted. Bladder is non tender and no masses felt.  AA is 0 Fall risk is low    12/19/2023    1:36 PM 01/16/2017    9:01 AM  Depression screen PHQ 2/9  Decreased Interest 0 0  Down, Depressed, Hopeless 0 0  PHQ - 2 Score 0 0  Altered sleeping 1   Tired, decreased energy 0   Change in appetite 0   Feeling bad or failure about yourself  0   Trouble concentrating 0   Moving slowly or fidgety/restless 0   Suicidal thoughts 0   PHQ-9 Score 1        12/19/2023    1:37 PM  GAD 7 : Generalized Anxiety Score  Nervous, Anxious, on Edge 0  Control/stop worrying 0  Worry too much - different things 0  Trouble relaxing 0  Restless 0  Easily annoyed or irritable 0  Afraid - awful might  happen 0  Total GAD 7 Score 0      Upstream - 12/19/23 1333       Pregnancy Intention Screening   Does the patient want to become pregnant in the next year? N/A    Does the patient's partner want to become pregnant in the next year? N/A    Would the patient like to discuss contraceptive options today? N/A      Contraception Wrap Up   Current Method No Method - Other Reason   PM   End Method No Method - Other Reason   PM   Contraception Counseling Provided No         Examination chaperoned by Clarita Salt LPN  Assessment:     1. Routine Papanicolaou smear Pap sent  Can be last pap if negative  - Cytology - PAP( [place])  2. Lichen sclerosus Rx temovate    3. Vulvar itching (Primary) + irritation in vaginal area, had itching on vulva but found old temovate  cream and used some of that and it is better. Has LS, will rx temovate  Meds ordered this encounter  Medications   clobetasol  cream (TEMOVATE ) 0.05 %    Sig: Use cream 2 x daily for 2 weeks then  2-3 x per week    Dispense:  30 g    Refill:  3    Supervising Provider:   JAYNE, LUTHER H [[id number]]     4. Hypertension, unspecified type Follow up with PCP, take copy of AVS with BP readings for his review    She says she is nervous   Plan:     Follow up in 6 months or sooner if needed

## 2023-12-25 ENCOUNTER — Ambulatory Visit: Payer: Self-pay | Admitting: Adult Health

## 2023-12-25 LAB — CYTOLOGY - PAP
Comment: NEGATIVE
Diagnosis: NEGATIVE
High risk HPV: NEGATIVE

## 2023-12-25 NOTE — Telephone Encounter (Signed)
 Pt aware of normal pap. Pt voiced understanding. JSY

## 2023-12-25 NOTE — Telephone Encounter (Signed)
-----   Message from [place] sent at 12/25/2023  1:18 PM EDT ----- Let her know pap was negative HPV and malignancy, +atrophy that happens with age. THX jenn
